# Patient Record
Sex: Male | Born: 1950 | Race: White | Hispanic: No | Smoking: Current every day smoker
Health system: Southern US, Community
[De-identification: ages and names within clinical notes are randomized; demographics above are authoritative.]

## PROBLEM LIST (undated history)

## (undated) DIAGNOSIS — J449 Chronic obstructive pulmonary disease, unspecified: Secondary | ICD-10-CM

## (undated) DIAGNOSIS — E039 Hypothyroidism, unspecified: Secondary | ICD-10-CM

## (undated) DIAGNOSIS — F4310 Post-traumatic stress disorder, unspecified: Secondary | ICD-10-CM

## (undated) DIAGNOSIS — Z9989 Dependence on other enabling machines and devices: Secondary | ICD-10-CM

## (undated) DIAGNOSIS — D649 Anemia, unspecified: Secondary | ICD-10-CM

## (undated) DIAGNOSIS — F32A Depression, unspecified: Secondary | ICD-10-CM

## (undated) DIAGNOSIS — I10 Essential (primary) hypertension: Secondary | ICD-10-CM

## (undated) DIAGNOSIS — F329 Major depressive disorder, single episode, unspecified: Secondary | ICD-10-CM

## (undated) DIAGNOSIS — G4733 Obstructive sleep apnea (adult) (pediatric): Secondary | ICD-10-CM

## (undated) HISTORY — DX: Major depressive disorder, single episode, unspecified: F32.9

## (undated) HISTORY — PX: FACIAL RECONSTRUCTION SURGERY: SHX631

## (undated) HISTORY — DX: Depression, unspecified: F32.A

## (undated) HISTORY — DX: Essential (primary) hypertension: I10

## (undated) HISTORY — PX: ARTHROSCOPY KNEE W/ DRILLING: SUR92

## (undated) HISTORY — DX: Hypothyroidism, unspecified: E03.9

## (undated) HISTORY — DX: Anemia, unspecified: D64.9

## (undated) HISTORY — DX: Dependence on other enabling machines and devices: Z99.89

## (undated) HISTORY — DX: Obstructive sleep apnea (adult) (pediatric): G47.33

## (undated) HISTORY — DX: Post-traumatic stress disorder, unspecified: F43.10

## (undated) HISTORY — DX: Chronic obstructive pulmonary disease, unspecified: J44.9

---

## 1998-08-17 ENCOUNTER — Encounter: Payer: Self-pay | Admitting: Emergency Medicine

## 1998-08-17 ENCOUNTER — Inpatient Hospital Stay (HOSPITAL_COMMUNITY): Admission: EM | Admit: 1998-08-17 | Discharge: 1998-08-18 | Payer: Self-pay | Admitting: Emergency Medicine

## 2000-08-10 ENCOUNTER — Ambulatory Visit (HOSPITAL_COMMUNITY): Admission: RE | Admit: 2000-08-10 | Discharge: 2000-08-10 | Payer: Self-pay | Admitting: *Deleted

## 2000-08-19 ENCOUNTER — Ambulatory Visit (HOSPITAL_COMMUNITY): Admission: RE | Admit: 2000-08-19 | Discharge: 2000-08-19 | Payer: Self-pay | Admitting: *Deleted

## 2000-08-19 ENCOUNTER — Encounter: Payer: Self-pay | Admitting: *Deleted

## 2001-02-20 ENCOUNTER — Encounter: Admission: RE | Admit: 2001-02-20 | Discharge: 2001-02-20 | Payer: Self-pay | Admitting: Oncology

## 2001-02-20 ENCOUNTER — Encounter (HOSPITAL_COMMUNITY): Admission: RE | Admit: 2001-02-20 | Discharge: 2001-03-22 | Payer: Self-pay | Admitting: Oncology

## 2001-03-03 ENCOUNTER — Encounter (HOSPITAL_COMMUNITY): Payer: Self-pay | Admitting: Oncology

## 2001-03-10 ENCOUNTER — Encounter (HOSPITAL_COMMUNITY): Payer: Self-pay | Admitting: Oncology

## 2001-03-17 ENCOUNTER — Observation Stay (HOSPITAL_COMMUNITY): Admission: RE | Admit: 2001-03-17 | Discharge: 2001-03-18 | Payer: Self-pay | Admitting: General Surgery

## 2001-07-28 ENCOUNTER — Emergency Department (HOSPITAL_COMMUNITY): Admission: EM | Admit: 2001-07-28 | Discharge: 2001-07-28 | Payer: Self-pay | Admitting: Emergency Medicine

## 2001-09-28 ENCOUNTER — Ambulatory Visit (HOSPITAL_COMMUNITY): Admission: RE | Admit: 2001-09-28 | Discharge: 2001-09-28 | Payer: Self-pay | Admitting: Family Medicine

## 2001-09-28 ENCOUNTER — Encounter: Payer: Self-pay | Admitting: Family Medicine

## 2001-12-08 ENCOUNTER — Encounter (HOSPITAL_COMMUNITY): Admission: RE | Admit: 2001-12-08 | Discharge: 2002-01-07 | Payer: Self-pay | Admitting: Orthopedic Surgery

## 2002-01-09 ENCOUNTER — Encounter (HOSPITAL_COMMUNITY): Admission: RE | Admit: 2002-01-09 | Discharge: 2002-02-08 | Payer: Self-pay | Admitting: Orthopedic Surgery

## 2002-02-08 ENCOUNTER — Encounter (HOSPITAL_COMMUNITY): Admission: RE | Admit: 2002-02-08 | Discharge: 2002-03-10 | Payer: Self-pay | Admitting: Orthopedic Surgery

## 2002-03-14 ENCOUNTER — Encounter (HOSPITAL_COMMUNITY): Admission: RE | Admit: 2002-03-14 | Discharge: 2002-04-13 | Payer: Self-pay | Admitting: Orthopedic Surgery

## 2004-09-15 ENCOUNTER — Emergency Department (HOSPITAL_COMMUNITY): Admission: EM | Admit: 2004-09-15 | Discharge: 2004-09-15 | Payer: Self-pay | Admitting: Emergency Medicine

## 2004-09-16 ENCOUNTER — Ambulatory Visit: Payer: Self-pay | Admitting: Orthopedic Surgery

## 2004-10-14 ENCOUNTER — Ambulatory Visit: Payer: Self-pay | Admitting: Orthopedic Surgery

## 2004-11-12 ENCOUNTER — Ambulatory Visit: Payer: Self-pay | Admitting: Orthopedic Surgery

## 2005-09-03 ENCOUNTER — Emergency Department (HOSPITAL_COMMUNITY): Admission: EM | Admit: 2005-09-03 | Discharge: 2005-09-03 | Payer: Self-pay | Admitting: Emergency Medicine

## 2006-08-20 ENCOUNTER — Emergency Department (HOSPITAL_COMMUNITY): Admission: EM | Admit: 2006-08-20 | Discharge: 2006-08-20 | Payer: Self-pay | Admitting: Emergency Medicine

## 2007-11-07 IMAGING — CT CT ABDOMEN W/O CM
1 of 2 series · 15 of 32 positions shown, 19 images · IV contrast (agent unspecified)
Comparison: None.

CLINICAL DATA: Right-sided flank pain for two days.
 ABDOMEN CT WITHOUT CONTRAST:
TECHNIQUE: Multidetector CT imaging of the abdomen was performed following the standard protocol without IV contrast.
TECHNIQUE: Multidetector CT imaging of the pelvis was performed following the standard protocol without IV contrast.

[Series 3078: — · axial · 0.66mm/px · z∈[+1367,+1732]mm · 15 of 81 slices shown, 19 images]
[im 4/81  soft-tissue]
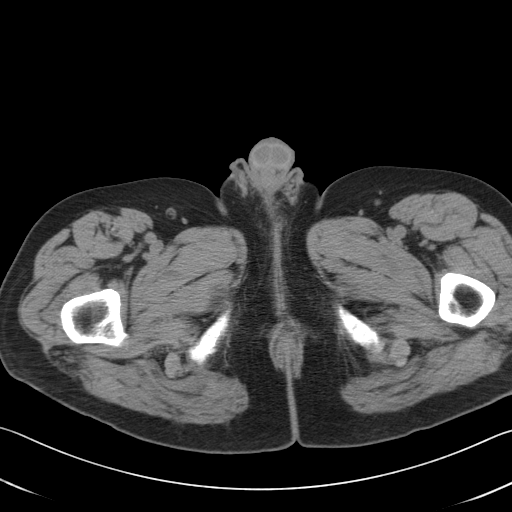
[im 4/81  bone]
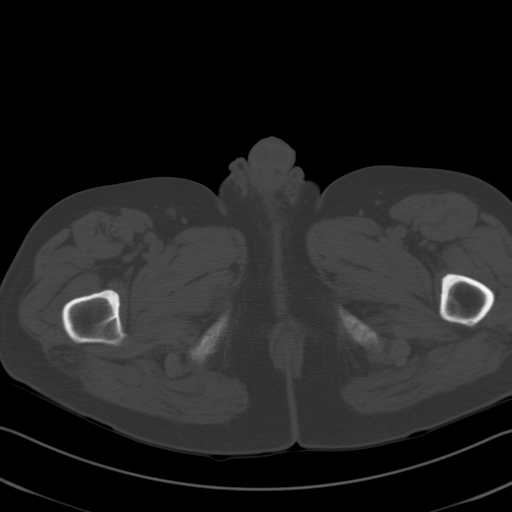
[im 10/81  soft-tissue]
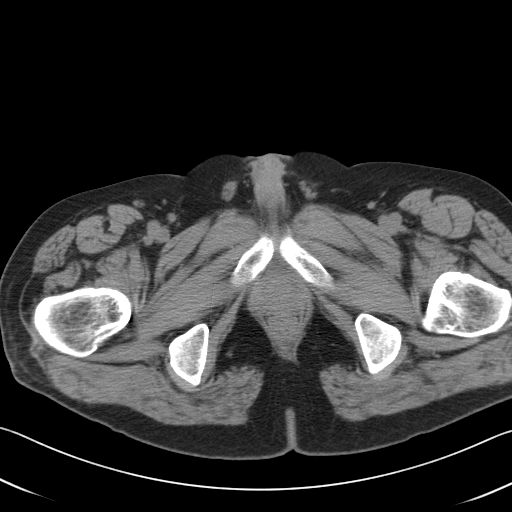
[im 17/81  soft-tissue]
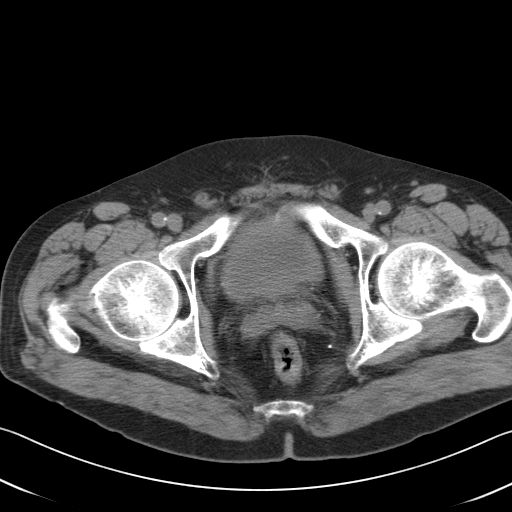
[im 23/81  soft-tissue]
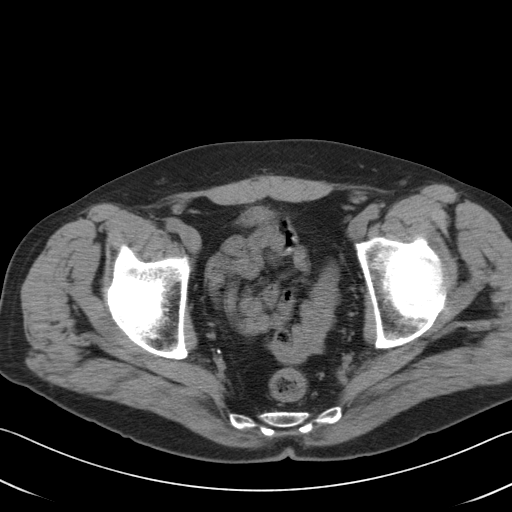
[im 29/81  soft-tissue]
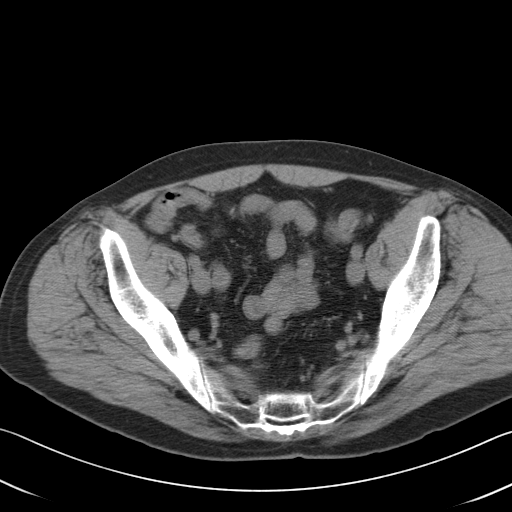
[im 36/81  soft-tissue]
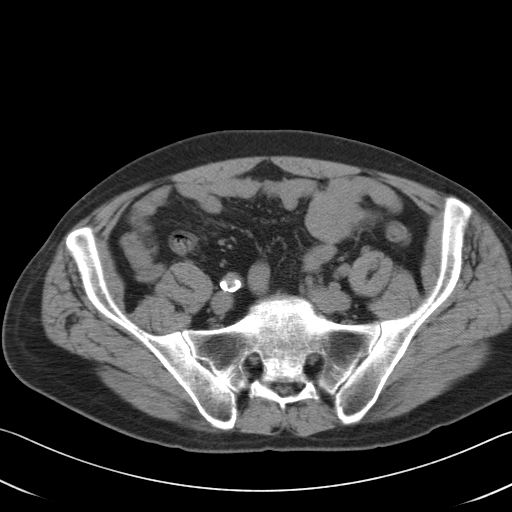
[im 42/81  soft-tissue]
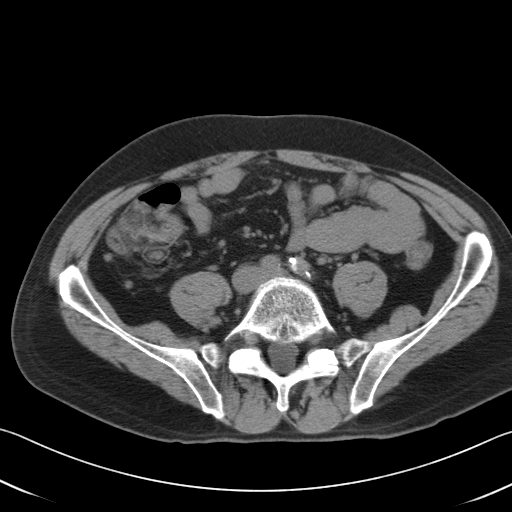
[im 45/81  soft-tissue]
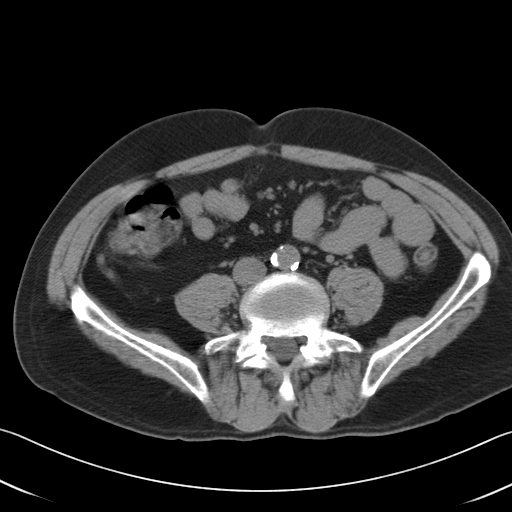
[im 52/81  soft-tissue]
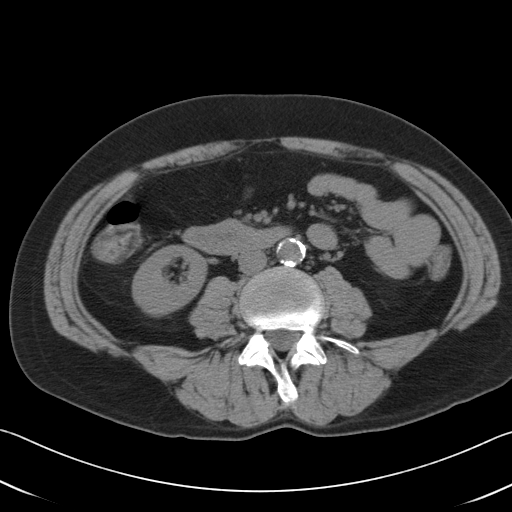
[im 52/81  bone]
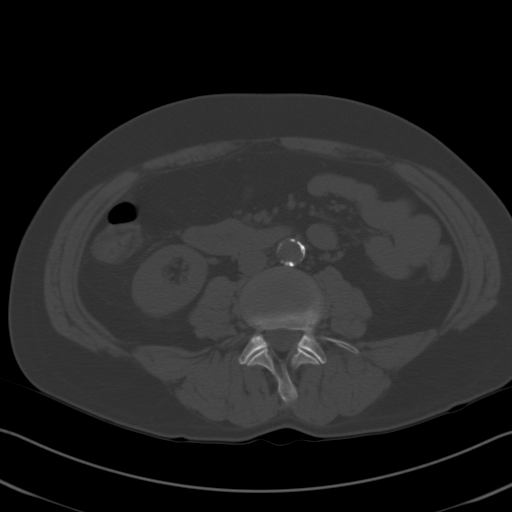
[im 58/81  soft-tissue]
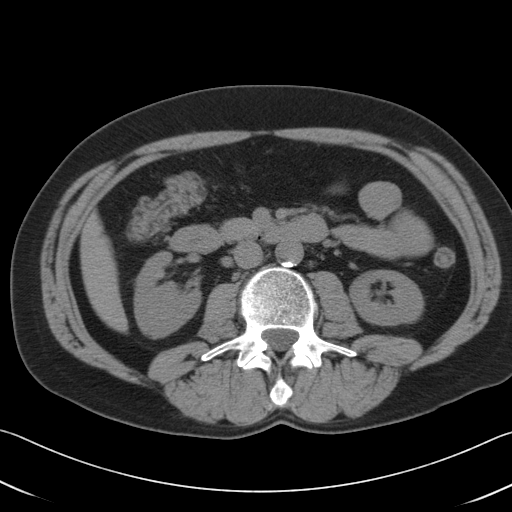
[im 65/81  soft-tissue]
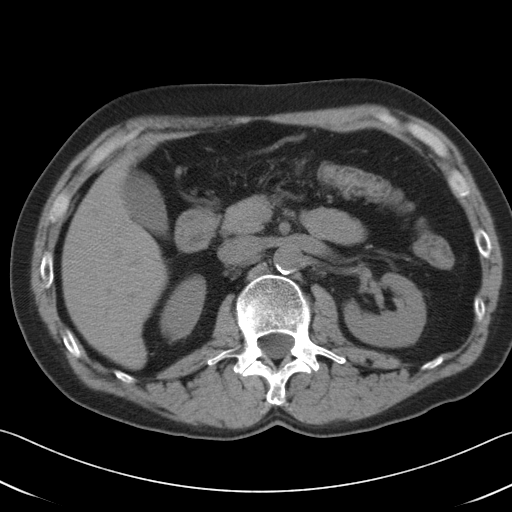
[im 68/81  lung]
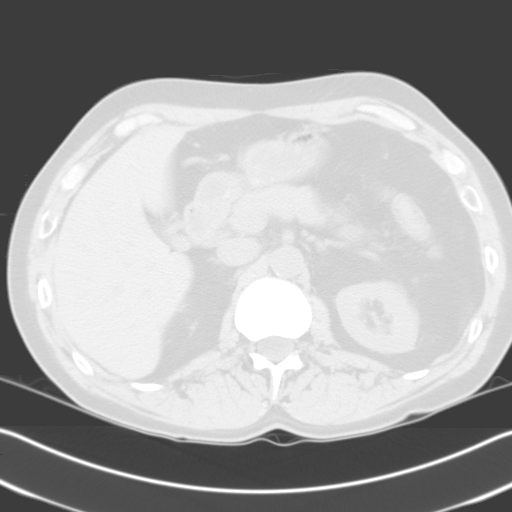
[im 71/81  soft-tissue]
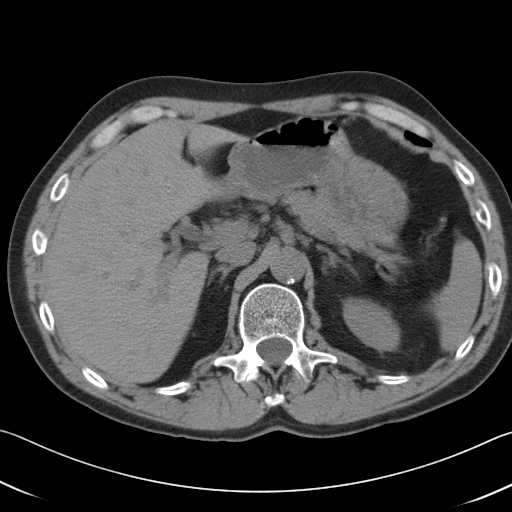
[im 71/81  lung]
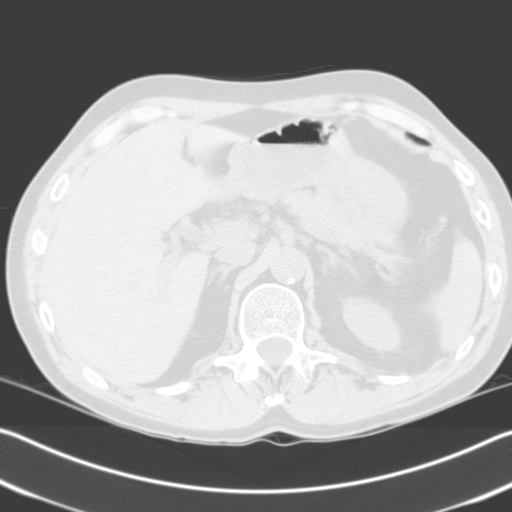
[im 74/81  lung]
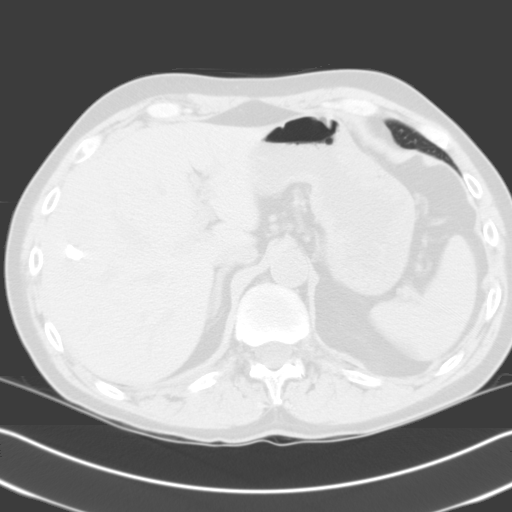
[im 77/81  soft-tissue]
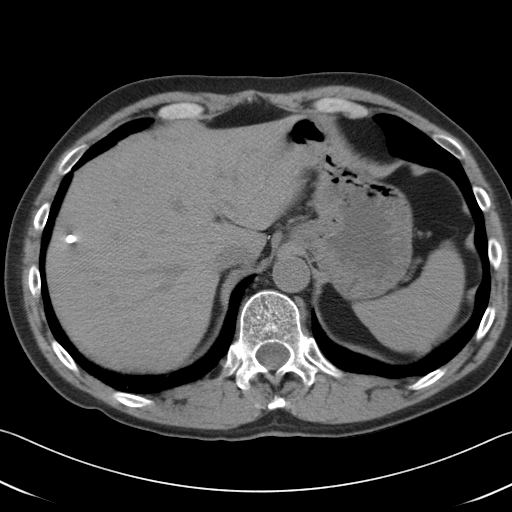
[im 77/81  lung]
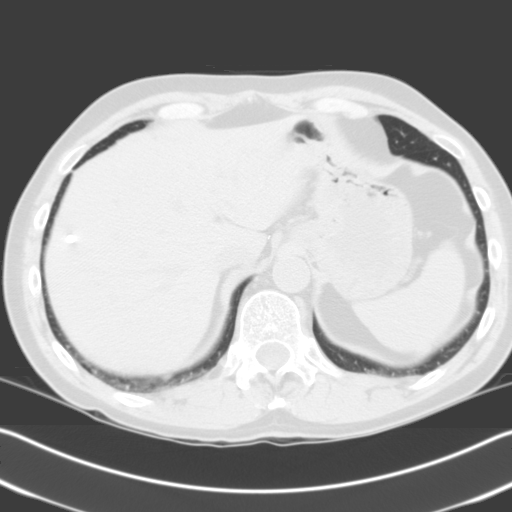

[15 of 32 positions shown; findings below may reference images not displayed]

FINDINGS: There is mild atelectasis at the lung bases.  
 Calcifications are seen within the right lobe of the liver, uncertain etiology.  The liver parenchyma is otherwise normal in attenuation and morphology.  No intrahepatic biliary ductal dilatation.  The gallbladder is negative.  The spleen is negative.  Both adrenal glands are negative.  The left kidney is normal without evidence for nephrolithiasis or hydronephrosis.  No left-sided hydroureter or ureterolithiasis.  
 The right kidney has several low-density lesions which are incompletely characterized with IV contrast, i.e. (image #26).  
 There is no right-sided nephrolithiasis, hydronephrosis, hydroureter or ureterolithiasis.  The urinary bladder is normal.
 Calcified atherosclerotic disease affects the abdominal aorta and its branches.  There is no evidence for abdominal aortic aneurysm, as stated in the clinical history.  The pancreatic parenchyma is normal.
 There is no free fluid.  
 The appendix is normal.  There is no evidence for bowel obstruction.  The small bowel loops are normal in course and in caliber.   No small bowel mesenteric adenopathy.
 Review of bone windows shows no suspicious lytic or sclerotic lesions.
IMPRESSION: 1. No acute intraabdominal CT findings.  
 2. Low-density lesions, right kidney, incompletely characterized without intravenous contrast.
 3. Normal appendix.  
 PELVIS CT WITHOUT CONTRAST:
FINDINGS: There is no adenopathy.  No free fluid.  Urinary bladder is negative.  Prostate gland and seminal vesicles are negative.  The visualized pelvic bowel loops are nonobstructive and there is no abnormal bowel wall thickening.  No abscess formation is noted. 
 Review of bone windows shows no suspicious lytic or sclerotic lesions.
IMPRESSION: No acute findings on pelvic CT.

## 2012-05-23 ENCOUNTER — Ambulatory Visit (INDEPENDENT_AMBULATORY_CARE_PROVIDER_SITE_OTHER): Payer: Medicare Other | Admitting: Family Medicine

## 2012-05-23 ENCOUNTER — Encounter: Payer: Self-pay | Admitting: Family Medicine

## 2012-05-23 VITALS — BP 130/70 | HR 63 | Resp 18 | Ht 71.5 in | Wt 158.1 lb

## 2012-05-23 DIAGNOSIS — F4310 Post-traumatic stress disorder, unspecified: Secondary | ICD-10-CM

## 2012-05-23 DIAGNOSIS — G8929 Other chronic pain: Secondary | ICD-10-CM

## 2012-05-23 DIAGNOSIS — Z720 Tobacco use: Secondary | ICD-10-CM

## 2012-05-23 DIAGNOSIS — E039 Hypothyroidism, unspecified: Secondary | ICD-10-CM

## 2012-05-23 DIAGNOSIS — F17200 Nicotine dependence, unspecified, uncomplicated: Secondary | ICD-10-CM

## 2012-05-23 DIAGNOSIS — J309 Allergic rhinitis, unspecified: Secondary | ICD-10-CM

## 2012-05-23 DIAGNOSIS — G43909 Migraine, unspecified, not intractable, without status migrainosus: Secondary | ICD-10-CM

## 2012-05-23 DIAGNOSIS — I10 Essential (primary) hypertension: Secondary | ICD-10-CM

## 2012-05-23 DIAGNOSIS — J449 Chronic obstructive pulmonary disease, unspecified: Secondary | ICD-10-CM

## 2012-05-23 MED ORDER — FLUTICASONE PROPIONATE 50 MCG/ACT NA SUSP: 2.0000 | Freq: Every day | NASAL | Status: DC

## 2012-05-23 MED ORDER — LORATADINE 10 MG PO TABS: 10.0000 mg | ORAL_TABLET | Freq: Every day | ORAL | Status: AC

## 2012-05-23 NOTE — Progress Notes (Signed)
  Subjective:    Patient ID: James Graham, male    DOB: 12/10/1950, 61 y.o.   MRN: [id number]  HPI  Pt here to establish care, PCP- [facility], previously in [place] for 12 years Medications and History reviewed  Concern about chronic rhinitis, he nose drains constantly has been on nasolide without any improvement, has also tried zyrtec which helped very little.  Chronic pain in back and legs, as well as migraines on norco as needed PTSD- followed by psych at [facility] 2012 - had polyps removed    Review of Systems - per above     GEN- denies fatigue, fever, weight loss,weakness, recent illness HEENT- denies eye drainage, change in vision,+ nasal discharge, CVS- denies chest pain, palpitations RESP- denies SOB, cough, wheeze ABD- denies N/V, change in stools, abd pain GU- denies dysuria, hematuria, dribbling, incontinence MSK- + joint pain, muscle aches, injury Neuro- denies headache, dizziness, syncope, seizure activity      Objective:   Physical Exam GEN- NAD, alert and oriented x3, walks with cane  HEENT- PERRL, EOMI, non injected sclera, pink conjunctiva, MMM, oropharynx clear, edentulous, TM clear bilat no effusion, clear rhinorrhea- mild turbinate enlargement, small circular erosion in right ear prior to canal  Neck- Supple, no LAD CVS- RRR, no murmur RESP-CTAB ABD-NABS,soft,NT, ND  EXT- No edema Pulses- Radial, DP- 2+ Psych-  Normal affect and Mood, very pleasant,       Assessment & Plan:

## 2012-05-23 NOTE — Patient Instructions (Signed)
Continue your current medications Try the flonase and new allergy pill loratadine Stop the nasalide  F/U 3 months

## 2012-05-25 ENCOUNTER — Encounter: Payer: Self-pay | Admitting: Family Medicine

## 2012-05-25 DIAGNOSIS — F4310 Post-traumatic stress disorder, unspecified: Secondary | ICD-10-CM | POA: Insufficient documentation

## 2012-05-25 DIAGNOSIS — G8929 Other chronic pain: Secondary | ICD-10-CM | POA: Insufficient documentation

## 2012-05-25 DIAGNOSIS — J449 Chronic obstructive pulmonary disease, unspecified: Secondary | ICD-10-CM | POA: Insufficient documentation

## 2012-05-25 DIAGNOSIS — E039 Hypothyroidism, unspecified: Secondary | ICD-10-CM | POA: Insufficient documentation

## 2012-05-25 DIAGNOSIS — I10 Essential (primary) hypertension: Secondary | ICD-10-CM | POA: Insufficient documentation

## 2012-05-25 DIAGNOSIS — J309 Allergic rhinitis, unspecified: Secondary | ICD-10-CM | POA: Insufficient documentation

## 2012-05-25 DIAGNOSIS — Z720 Tobacco use: Secondary | ICD-10-CM | POA: Insufficient documentation

## 2012-05-25 DIAGNOSIS — G43909 Migraine, unspecified, not intractable, without status migrainosus: Secondary | ICD-10-CM | POA: Insufficient documentation

## 2012-05-25 NOTE — Assessment & Plan Note (Signed)
BP stable, no change to meds  

## 2012-05-25 NOTE — Assessment & Plan Note (Signed)
Continue albuterol and symbicort

## 2012-05-25 NOTE — Assessment & Plan Note (Signed)
Long term use of narcotics,currently obtained by the [place]

## 2012-05-25 NOTE — Assessment & Plan Note (Signed)
D/C nasolide, trial of flonase and loratadine

## 2012-08-07 ENCOUNTER — Encounter: Payer: Self-pay | Admitting: Family Medicine

## 2012-08-22 ENCOUNTER — Ambulatory Visit (INDEPENDENT_AMBULATORY_CARE_PROVIDER_SITE_OTHER): Payer: Medicare Other | Admitting: Family Medicine

## 2012-08-22 ENCOUNTER — Encounter: Payer: Self-pay | Admitting: Family Medicine

## 2012-08-22 VITALS — BP 120/70 | HR 65 | Resp 15 | Ht 71.5 in | Wt 172.0 lb

## 2012-08-22 DIAGNOSIS — J449 Chronic obstructive pulmonary disease, unspecified: Secondary | ICD-10-CM

## 2012-08-22 DIAGNOSIS — J019 Acute sinusitis, unspecified: Secondary | ICD-10-CM

## 2012-08-22 DIAGNOSIS — I10 Essential (primary) hypertension: Secondary | ICD-10-CM

## 2012-08-22 MED ORDER — FLUTICASONE PROPIONATE 50 MCG/ACT NA SUSP: 2.0000 | Freq: Every day | NASAL | Status: AC

## 2012-08-22 MED ORDER — PREDNISONE 10 MG PO TABS: ORAL_TABLET | ORAL | Status: DC

## 2012-08-22 MED ORDER — AMOXICILLIN 500 MG PO CAPS: 500.0000 mg | ORAL_CAPSULE | Freq: Two times a day (BID) | ORAL | Status: AC

## 2012-08-22 NOTE — Progress Notes (Signed)
  Subjective:    Patient ID: James Graham, male    DOB: [birth year], 61 y.o.   MRN: [id number]  HPI Patient here to follow chronic medical problems. He's been having increased sinus drainage and pressure for the past couple weeks. He was on the Flonase which helped the postnasal drip however now he has coughing because of drainage into his chest. He denies any fever, shortness of breath. Occasionally wheezes. He plans to get flu shot at his routine visit at the [facility]. Tolerating her medications otherwise.   Review of Systems  GEN- denies fatigue, fever, weight loss,weakness, recent illness HEENT- denies eye drainage, change in vision, nasal discharge, CVS- denies chest pain, palpitations RESP- denies SOB, cough, wheeze ABD- denies N/V, change in stools, abd pain GU- denies dysuria, hematuria, dribbling, incontinence MSK- +joint pain, muscle aches, injury Neuro- denies headache, dizziness, syncope, seizure activity      Objective:   Physical Exam GEN- NAD, alert and oriented x3 HEENT- PERRL, EOMI, non injected sclera, pink conjunctiva, MMM, oropharynx mild injection, TM clear bilat no effusion, + maxillary sinus tenderness, inflammed turbinates, + Nasal drainage  Neck- Supple, no LAD CVS- RRR, no murmur ABS-NABS,soft,NT,ND RESP-bilateral wheeze, + upper airway congestion, mild rhonchi, normal WOB  EXT- No edema Pulses- Radial 2+         Assessment & Plan:

## 2012-08-22 NOTE — Assessment & Plan Note (Signed)
BP looks great today!! 

## 2012-08-22 NOTE — Assessment & Plan Note (Signed)
Antibiotics, refilled flonase, I think he would benefit from seeing ENT with his recurrent rhinitis

## 2012-08-22 NOTE — Assessment & Plan Note (Signed)
Treat for COPD , pt has acute sinus infection though not complaining of SOB, I fear he will worsen based on his lung exam which has changed Prednisone, antibiotics

## 2012-08-22 NOTE — Patient Instructions (Addendum)
Use the inhaler every 4 hours as needed Start prednisone and antibiotics You have bronchitis and sinus infection You need to see [facility] N[facility] Th[facility], [facility] Use the ointment in your nose  Do not start the flonase until next week  F/U 4 months

## 2012-10-11 DIAGNOSIS — G4733 Obstructive sleep apnea (adult) (pediatric): Secondary | ICD-10-CM

## 2012-10-11 HISTORY — DX: Obstructive sleep apnea (adult) (pediatric): G47.33

## 2012-12-21 ENCOUNTER — Ambulatory Visit: Payer: Medicare Other | Admitting: Family Medicine

## 2012-12-26 ENCOUNTER — Encounter: Payer: Self-pay | Admitting: Family Medicine

## 2012-12-26 ENCOUNTER — Ambulatory Visit (INDEPENDENT_AMBULATORY_CARE_PROVIDER_SITE_OTHER): Payer: Medicare Other | Admitting: Family Medicine

## 2012-12-26 VITALS — BP 138/80 | HR 72 | Resp 16 | Wt 170.0 lb

## 2012-12-26 DIAGNOSIS — Z720 Tobacco use: Secondary | ICD-10-CM

## 2012-12-26 DIAGNOSIS — F17200 Nicotine dependence, unspecified, uncomplicated: Secondary | ICD-10-CM

## 2012-12-26 DIAGNOSIS — G4733 Obstructive sleep apnea (adult) (pediatric): Secondary | ICD-10-CM

## 2012-12-26 DIAGNOSIS — I10 Essential (primary) hypertension: Secondary | ICD-10-CM

## 2012-12-26 DIAGNOSIS — E039 Hypothyroidism, unspecified: Secondary | ICD-10-CM

## 2012-12-26 DIAGNOSIS — Z9989 Dependence on other enabling machines and devices: Secondary | ICD-10-CM

## 2012-12-26 DIAGNOSIS — J449 Chronic obstructive pulmonary disease, unspecified: Secondary | ICD-10-CM

## 2012-12-26 NOTE — Assessment & Plan Note (Signed)
Counseled on cessation, he has cut down significantly

## 2012-12-26 NOTE — Progress Notes (Signed)
  Subjective:    Patient ID: James Graham, male    DOB: 04-Oct-1951, 62 y.o.   MRN: [id number]  HPI  Pt here to f/u chronic medical problems. No specific concerns. He is due to have ear nose and throat surgery secondary to polyps and deviated septum. He was also diagnosed with obstructive sleep apnea and has been on CPAP for the past week. COPD is doing well. [facility] in appointment. 2 weeks   Review of Systems   GEN- denies fatigue, fever, weight loss,weakness, recent illness HEENT- denies eye drainage, change in vision, nasal discharge, CVS- denies chest pain, palpitations RESP- denies SOB, cough, wheeze ABD- denies N/V, change in stools, abd pain GU- denies dysuria, hematuria, dribbling, incontinence MSK- denies joint pain, muscle aches, injury Neuro- denies headache, dizziness, syncope, seizure activity      Objective:   Physical Exam   GEN- NAD, alert and oriented x3 HEENT- PERRL, EOMI, non injected sclera, pink conjunctiva, MMM, oropharynx clear Neck- Supple, no thryomegaly CVS- RRR, no murmur RESP-CTAB EXT- No edema Pulses- Radial, DP- 2+        Assessment & Plan:

## 2012-12-26 NOTE — Patient Instructions (Signed)
Continue current medications F/U 6 months or as needed 

## 2012-12-26 NOTE — Assessment & Plan Note (Signed)
Back at baseline, breathing much improved

## 2012-12-26 NOTE — Assessment & Plan Note (Signed)
Repeat labs needed, he will have them done at [place] in 2 weeks

## 2012-12-26 NOTE — Assessment & Plan Note (Signed)
Well controlled 

## 2013-02-06 ENCOUNTER — Encounter: Payer: Self-pay | Admitting: *Deleted

## 2013-06-27 ENCOUNTER — Ambulatory Visit: Payer: Self-pay | Admitting: Family Medicine

## 2013-06-28 ENCOUNTER — Ambulatory Visit: Payer: Medicare Other | Admitting: Family Medicine

## 2013-07-30 ENCOUNTER — Ambulatory Visit: Payer: Self-pay | Admitting: Family Medicine

## 2013-08-02 ENCOUNTER — Ambulatory Visit (INDEPENDENT_AMBULATORY_CARE_PROVIDER_SITE_OTHER): Payer: Medicare Other | Admitting: Family Medicine

## 2013-08-02 ENCOUNTER — Encounter: Payer: Self-pay | Admitting: Family Medicine

## 2013-08-02 VITALS — BP 110/60 | HR 80 | Temp 97.3°F | Resp 18 | Wt 164.0 lb

## 2013-08-02 DIAGNOSIS — I10 Essential (primary) hypertension: Secondary | ICD-10-CM

## 2013-08-02 DIAGNOSIS — E039 Hypothyroidism, unspecified: Secondary | ICD-10-CM

## 2013-08-02 DIAGNOSIS — G8929 Other chronic pain: Secondary | ICD-10-CM

## 2013-08-02 NOTE — Progress Notes (Signed)
Subjective:    Patient ID: James Graham, male    DOB: 05-Nov-1950, 62 y.o.   MRN: [id number]  HPI Patient's here today for followup of his hypertension. His blood pressure is well controlled 110/60. He denies any chest pain or shortness of breath at rest. He does have some baseline dyspnea on exertion due to COPD this is stable. He is also here today to discuss his hypothyroidism. He is currently on levothyroxine 25 mcg by mouth daily. He recently had labwork done at the [place] which reportedly was normal. The patient will get this labwork and brain office I can review it. He continues to complain of daily headaches and chronic pain in his shoulders and lower back. He is currently taking gabapentin 300 mg by mouth 3 times a day. He also takes Norco 5/325 as needed for pain. He is interested in further options for pain control. He's had his flu shot as well as his pneumonia shot at the [place]. Past Medical History  Diagnosis Date  . Hypertension   . Hypothyroidism   . COPD (chronic obstructive pulmonary disease)     PFT 2012 mild obstruction  . Depression   . PTSD (post-traumatic stress disorder)   . Anemia   . OSA on CPAP 2014    [facility]   Current Outpatient Prescriptions on File Prior to Visit  Medication Sig Dispense Refill  . albuterol (PROVENTIL HFA;VENTOLIN HFA) 108 (90 BASE) MCG/ACT inhaler Inhale 2 puffs into the lungs every 4 (four) hours as needed.      . budesonide-formoterol (SYMBICORT) 80-4.5 MCG/ACT inhaler Inhale 2 puffs into the lungs 2 (two) times daily.      Marland Kitchen buPROPion (WELLBUTRIN) 100 MG tablet Take 100 mg by mouth daily.      . Calcium Carbonate-Vitamin D (CALCIUM 600 + D PO) Take by mouth.      . fluticasone (FLONASE) 50 MCG/ACT nasal spray Place 2 sprays into the nose daily.  16 g  2  . folic acid (FOLVITE) 1 MG tablet Take 1 mg by mouth daily.      Marland Kitchen gabapentin (NEURONTIN) 300 MG capsule Take 300 mg by mouth 3 (three) times daily.      . hydrochlorothiazide (HYDRODIURIL) 25 MG  tablet Take 25 mg by mouth daily. Take 1/2 tablet daily      . HYDROcodone-acetaminophen (NORCO/VICODIN) 5-325 MG per tablet Take 1 tablet by mouth 2 (two) times daily.      . hydroxypropyl methylcellulose (ISOPTO TEARS) 2.5 % ophthalmic solution 1 drop.      Marland Kitchen LEVOTHYROXINE SODIUM PO Take 0.025 mg by mouth daily.      Marland Kitchen omeprazole (PRILOSEC) 20 MG capsule Take 20 mg by mouth daily. Take 2 capsules daily      . predniSONE (DELTASONE) 10 MG tablet Take  40mg  x 5  20 tablet  0  . risperiDONE (RISPERDAL) 3 MG tablet Take 3 mg by mouth daily. 1/2 tablet daily at bedtime      . sertraline (ZOLOFT) 100 MG tablet Take 100 mg by mouth daily. Take two before bedtime      . Travoprost, BAK Free, (TRAVATAN) 0.004 % SOLN ophthalmic solution Place 1 drop into both eyes at bedtime.      . traZODone (DESYREL) 100 MG tablet Take 100 mg by mouth at bedtime. Take 2 tablets daily at bedtime as needed for sleep      . loratadine (CLARITIN) 10 MG tablet Take 1 tablet (10 mg total) by mouth daily.  30 tablet  2   No current facility-administered medications on file prior to visit.   Allergies  Allergen Reactions  . Bee Venom Anaphylaxis  . Coconut Oil Anaphylaxis and Hives  . Palm Oil [Hydrogenated Palm Oil Glycerides] Anaphylaxis and Hives  . Advil [Ibuprofen] Rash  . Bacitracin Itching and Rash   History   Social History  . Marital Status: Divorced    Spouse Name: N/A    Number of Children: N/A  . Years of Education: N/A   Occupational History  . Not on file.   Social History Main Topics  . Smoking status: Current Every Day Smoker -- 0.50 packs/day  . Smokeless tobacco: Not on file  . Alcohol Use: .5 - 1 oz/week    1-2 drink(s) per week  . Drug Use: No  . Sexual Activity: Not on file   Other Topics Concern  . Not on file   Social History Narrative  . No narrative on file      Review of Systems  All other systems reviewed and are negative.       Objective:   Physical Exam  Vitals  reviewed. Constitutional: He appears well-developed and well-nourished.  Neck: Neck supple. No JVD present. No thyromegaly present.  Cardiovascular: Normal rate, regular rhythm, normal heart sounds and intact distal pulses.  Exam reveals no gallop and no friction rub.   No murmur heard. Pulmonary/Chest: Effort normal. No respiratory distress. He has wheezes. He has no rales. He exhibits no tenderness.  Abdominal: Soft. Bowel sounds are normal. He exhibits no distension and no mass. There is no tenderness. There is no rebound and no guarding.  Lymphadenopathy:    He has no cervical adenopathy.          Assessment & Plan:  1. Hypothyroidism Bring by most recent labs I can review his TSH. I will titrate levothyroxin to achieve a TSH in the therapeutic range.  2. Essential hypertension, benign Blood pressure is well controlled. Continue hydrochlorothiazide.  Right to about his most recent lab work to check his fasting lipid panel, bmp, and potassium.  3. Chronic pain Increase gabapentin to 600 mg every morning, 300 mg by mouth every afternoon, and 600 mg by mouth each bedtime.

## 2014-01-06 ENCOUNTER — Emergency Department (HOSPITAL_COMMUNITY): Payer: Medicare HMO

## 2014-01-06 ENCOUNTER — Encounter (HOSPITAL_COMMUNITY): Payer: Self-pay | Admitting: Emergency Medicine

## 2014-01-06 ENCOUNTER — Emergency Department (HOSPITAL_COMMUNITY)
Admission: EM | Admit: 2014-01-06 | Discharge: 2014-01-06 | Disposition: A | Discharge status: Home / Self Care | Payer: Medicare HMO | Attending: Emergency Medicine | Admitting: Emergency Medicine

## 2014-01-06 DIAGNOSIS — Y939 Activity, unspecified: Secondary | ICD-10-CM | POA: Insufficient documentation

## 2014-01-06 DIAGNOSIS — J4489 Other specified chronic obstructive pulmonary disease: Secondary | ICD-10-CM | POA: Insufficient documentation

## 2014-01-06 DIAGNOSIS — F3289 Other specified depressive episodes: Secondary | ICD-10-CM | POA: Insufficient documentation

## 2014-01-06 DIAGNOSIS — F329 Major depressive disorder, single episode, unspecified: Secondary | ICD-10-CM | POA: Insufficient documentation

## 2014-01-06 DIAGNOSIS — F4310 Post-traumatic stress disorder, unspecified: Secondary | ICD-10-CM | POA: Insufficient documentation

## 2014-01-06 DIAGNOSIS — R296 Repeated falls: Secondary | ICD-10-CM | POA: Insufficient documentation

## 2014-01-06 DIAGNOSIS — IMO0002 Reserved for concepts with insufficient information to code with codable children: Secondary | ICD-10-CM | POA: Insufficient documentation

## 2014-01-06 DIAGNOSIS — Z79899 Other long term (current) drug therapy: Secondary | ICD-10-CM | POA: Insufficient documentation

## 2014-01-06 DIAGNOSIS — G4733 Obstructive sleep apnea (adult) (pediatric): Secondary | ICD-10-CM | POA: Insufficient documentation

## 2014-01-06 DIAGNOSIS — S20219A Contusion of unspecified front wall of thorax, initial encounter: Secondary | ICD-10-CM

## 2014-01-06 DIAGNOSIS — F17200 Nicotine dependence, unspecified, uncomplicated: Secondary | ICD-10-CM | POA: Insufficient documentation

## 2014-01-06 DIAGNOSIS — D649 Anemia, unspecified: Secondary | ICD-10-CM | POA: Insufficient documentation

## 2014-01-06 DIAGNOSIS — J449 Chronic obstructive pulmonary disease, unspecified: Secondary | ICD-10-CM | POA: Insufficient documentation

## 2014-01-06 DIAGNOSIS — E039 Hypothyroidism, unspecified: Secondary | ICD-10-CM | POA: Insufficient documentation

## 2014-01-06 DIAGNOSIS — I10 Essential (primary) hypertension: Secondary | ICD-10-CM | POA: Insufficient documentation

## 2014-01-06 DIAGNOSIS — Y929 Unspecified place or not applicable: Secondary | ICD-10-CM | POA: Insufficient documentation

## 2014-01-06 MED ORDER — HYDROCODONE-ACETAMINOPHEN 5-325 MG PO TABS: 1.0000 | ORAL_TABLET | ORAL | Status: AC | PRN

## 2014-01-06 NOTE — ED Notes (Signed)
Fell x 1 wk ago hitting center chest on edge of bathtub.  Denies hitting head.  Reports sternal cp since.

## 2014-01-06 NOTE — ED Provider Notes (Signed)
CSN: [id number]     Arrival date & time 01/06/14  0910 History   First MD Initiated Contact with Patient 01/06/14 [id number]     This chart was scribed for Raeford RazorStephen King Pinzon, MD by Arlan OrganAshley Leger, ED Scribe. This patient was seen in room APA07/APA07 and the patient's care was started 9:47 AM.   Chief Complaint  Patient presents with  . Fall   The history is provided by the patient. No language interpreter was used.    HPI Comments: James Graham is a 63 y.o. male who presents to the Emergency Department complaining of a fall x 1 week. Pt states he fell while in the bathtub landing on his R arm and chest. Denies any head injury or trauma at time of fall. Pt now c/o pain to the center of his chest and pain to the R arm that is constant and gradually worsening. He has not tried anything OTC or prescribed for his discomfort. Pt states he typically takes Hydrocodone for a "blood disease", but says he is currently out of his prescription. At this time he denies any SOB, dysuria, nausea, or abdominal . No other concerns this visit.    Past Medical History  Diagnosis Date  . Hypertension   . Hypothyroidism   . COPD (chronic obstructive pulmonary disease)     PFT 2012 mild obstruction  . Depression   . PTSD (post-traumatic stress disorder)   . Anemia   . OSA on CPAP 2014    [facility]   Past Surgical History  Procedure Laterality Date  . Arthroscopy knee w/ drilling      left  . Facial reconstruction surgery     Family History  Problem Relation Age of Onset  . Cancer Mother   . Hyperlipidemia Mother   . Hypertension Mother   . Heart disease Mother   . Hyperlipidemia Father   . Hypertension Father    History  Substance Use Topics  . Smoking status: Current Every Day Smoker -- 0.50 packs/day    Types: Cigarettes  . Smokeless tobacco: Not on file  . Alcohol Use: .5 - 1 oz/week    1-2 drink(s) per week     Comment: occ    Review of Systems  Constitutional: Negative for fever and chills.  HENT:  Negative for congestion.   Respiratory: Negative for cough.   Cardiovascular: Positive for chest pain.  Gastrointestinal: Negative for nausea, vomiting and abdominal pain.  Musculoskeletal: Positive for arthralgias.  Skin: Negative for rash.  Psychiatric/Behavioral: Negative for confusion.  All other systems reviewed and are negative.      Allergies  Bee venom; Coconut oil; Palm oil; Advil; and Bacitracin  Home Medications   Current Outpatient Rx  Name  Route  Sig  Dispense  Refill  . albuterol (PROVENTIL HFA;VENTOLIN HFA) 108 (90 BASE) MCG/ACT inhaler   Inhalation   Inhale 2 puffs into the lungs every 4 (four) hours as needed.         . budesonide-formoterol (SYMBICORT) 80-4.5 MCG/ACT inhaler   Inhalation   Inhale 2 puffs into the lungs 2 (two) times daily.         Marland Kitchen. buPROPion (WELLBUTRIN) 100 MG tablet   Oral   Take 100 mg by mouth daily.         . Calcium Carbonate-Vitamin D (CALCIUM 600 + D PO)   Oral   Take by mouth.         . EXPIRED: fluticasone (FLONASE) 50 MCG/ACT nasal spray  Nasal   Place 2 sprays into the nose daily.   16 g   2   . folic acid (FOLVITE) 1 MG tablet   Oral   Take 1 mg by mouth daily.         Marland Kitchen gabapentin (NEURONTIN) 300 MG capsule   Oral   Take 300 mg by mouth 3 (three) times daily.         . hydrochlorothiazide (HYDRODIURIL) 25 MG tablet   Oral   Take 25 mg by mouth daily. Take 1/2 tablet daily         . HYDROcodone-acetaminophen (NORCO/VICODIN) 5-325 MG per tablet   Oral   Take 1 tablet by mouth 2 (two) times daily.         . hydroxypropyl methylcellulose (ISOPTO TEARS) 2.5 % ophthalmic solution      1 drop.         Marland Kitchen LEVOTHYROXINE SODIUM PO   Oral   Take 0.025 mg by mouth daily.         Marland Kitchen EXPIRED: loratadine (CLARITIN) 10 MG tablet   Oral   Take 1 tablet (10 mg total) by mouth daily.   30 tablet   2   . omeprazole (PRILOSEC) 20 MG capsule   Oral   Take 20 mg by mouth daily. Take 2 capsules  daily         . predniSONE (DELTASONE) 10 MG tablet      Take  40mg  x 5   20 tablet   0   . risperiDONE (RISPERDAL) 3 MG tablet   Oral   Take 3 mg by mouth daily. 1/2 tablet daily at bedtime         . sertraline (ZOLOFT) 100 MG tablet   Oral   Take 100 mg by mouth daily. Take two before bedtime         . Travoprost, BAK Free, (TRAVATAN) 0.004 % SOLN ophthalmic solution   Both Eyes   Place 1 drop into both eyes at bedtime.         . traZODone (DESYREL) 100 MG tablet   Oral   Take 100 mg by mouth at bedtime. Take 2 tablets daily at bedtime as needed for sleep          Triage Vitals: BP 142/75  Pulse 67  Temp(Src) 97.7 F (36.5 C) (Oral)  Resp 20  Ht 5\' 11"  (1.803 m)  Wt 163 lb (73.936 kg)  BMI 22.74 kg/m2  SpO2 100%   Physical Exam  Nursing note and vitals reviewed. Constitutional: He is oriented to person, place, and time. He appears well-developed and well-nourished.  HENT:  Head: Normocephalic.  Eyes: EOM are normal.  Neck: Normal range of motion.  Cardiovascular: Normal rate and regular rhythm.   No murmur heard. Pulmonary/Chest: Effort normal and breath sounds normal.  Abdominal: He exhibits no distension.  Musculoskeletal: Normal range of motion.  Tenderness to palpation over mid sternal No crepitus or deformity  Neurological: He is alert and oriented to person, place, and time.  Skin:  No overlying skin changes  Psychiatric: He has a normal mood and affect.    ED Course  Procedures (including critical care time)  DIAGNOSTIC STUDIES: Oxygen Saturation is 100% on RA, Normal by my interpretation.    COORDINATION OF CARE: 9:58 AM- Will order DG chest 2 view. Discussed treatment plan with pt at bedside and pt agreed to plan.     Labs Review Labs Reviewed - No data to display  Imaging Review Dg Chest 2 View  01/06/2014   CLINICAL DATA:  Fall 1 week ago and hit sternum. Patient complains of pain.  EXAM: CHEST  2 VIEW  COMPARISON:  None.   FINDINGS: Two views of the chest demonstrate mild hyperinflation. There is no focal airspace disease or pneumothorax. Heart and mediastinum are within normal limits. There is no gross abnormality to the sternum. No acute bone abnormality.  IMPRESSION: No acute cardiopulmonary disease.   Electronically Signed   By: Richarda Overlie M.D.   On: 01/06/2014 09:50     EKG Interpretation None      MDM   Final diagnoses:  Contusion of chest    63yM with CP after fall. Imaging neg. Symptomatic tx.   I personally preformed the services scribed in my presence. The recorded information has been reviewed is accurate. Raeford Razor, MD.   Raeford Razor, MD 01/10/14 [phone]

## 2014-01-06 NOTE — Discharge Instructions (Signed)
Chest Contusion A chest contusion is a deep bruise on your chest area. Contusions are the result of an injury that caused bleeding under the skin. A chest contusion may involve bruising of the skin, muscles, or ribs. The contusion may turn blue, purple, or yellow. Minor injuries will give you a painless contusion, but more severe contusions may stay painful and swollen for a few weeks. CAUSES  A contusion is usually caused by a blow, trauma, or direct force to an area of the body. SYMPTOMS   Swelling and redness of the injured area.  Discoloration of the injured area.  Tenderness and soreness of the injured area.  Pain. DIAGNOSIS  The diagnosis can be made by taking a history and performing a physical exam. An X-ray, CT scan, or MRI may be needed to determine if there were any associated injuries, such as broken bones (fractures) or internal injuries. TREATMENT  Often, the best treatment for a chest contusion is resting, icing, and applying cold compresses to the injured area. Deep breathing exercises may be recommended to reduce the risk of pneumonia. Over-the-counter medicines may also be recommended for pain control. HOME CARE INSTRUCTIONS   Put ice on the injured area.  Put ice in a plastic bag.  Place a towel between your skin and the bag.  Leave the ice on for 15-20 minutes, 03-04 times a day.  Only take over-the-counter or prescription medicines as directed by your caregiver. Your caregiver may recommend avoiding anti-inflammatory medicines (aspirin, ibuprofen, and naproxen) for 48 hours because these medicines may increase bruising.  Rest the injured area.  Perform deep-breathing exercises as directed by your caregiver.  Stop smoking if you smoke.  Do not lift objects over 5 pounds (2.3 kg) for 3 days or longer if recommended by your caregiver. SEEK IMMEDIATE MEDICAL CARE IF:   You have increased bruising or swelling.  You have pain that is getting worse.  You have  difficulty breathing.  You have dizziness, weakness, or fainting.  You have blood in your urine or stool.  You cough up or vomit blood.  Your swelling or pain is not relieved with medicines. MAKE SURE YOU:   Understand these instructions.  Will watch your condition.  Will get help right away if you are not doing well or get worse. Document Released: 06/22/2001 Document Revised: 06/21/2012 Document Reviewed: 03/20/2012 [facility] Patient Information 2014 [place], [place].  Chest Contusion A chest contusion is a deep bruise on your chest area. Contusions are the result of an injury that caused bleeding under the skin. A chest contusion may involve bruising of the skin, muscles, or ribs. The contusion may turn blue, purple, or yellow. Minor injuries will give you a painless contusion, but more severe contusions may stay painful and swollen for a few weeks. CAUSES  A contusion is usually caused by a blow, trauma, or direct force to an area of the body. SYMPTOMS   Swelling and redness of the injured area.  Discoloration of the injured area.  Tenderness and soreness of the injured area.  Pain. DIAGNOSIS  The diagnosis can be made by taking a history and performing a physical exam. An X-ray, CT scan, or MRI may be needed to determine if there were any associated injuries, such as broken bones (fractures) or internal injuries. TREATMENT  Often, the best treatment for a chest contusion is resting, icing, and applying cold compresses to the injured area. Deep breathing exercises may be recommended to reduce the risk of pneumonia. Over-the-counter  medicines may also be recommended for pain control. HOME CARE INSTRUCTIONS   Put ice on the injured area.  Put ice in a plastic bag.  Place a towel between your skin and the bag.  Leave the ice on for 15-20 minutes, 03-04 times a day.  Only take over-the-counter or prescription medicines as directed by your caregiver. Your caregiver may  recommend avoiding anti-inflammatory medicines (aspirin, ibuprofen, and naproxen) for 48 hours because these medicines may increase bruising.  Rest the injured area.  Perform deep-breathing exercises as directed by your caregiver.  Stop smoking if you smoke.  Do not lift objects over 5 pounds (2.3 kg) for 3 days or longer if recommended by your caregiver. SEEK IMMEDIATE MEDICAL CARE IF:   You have increased bruising or swelling.  You have pain that is getting worse.  You have difficulty breathing.  You have dizziness, weakness, or fainting.  You have blood in your urine or stool.  You cough up or vomit blood.  Your swelling or pain is not relieved with medicines. MAKE SURE YOU:   Understand these instructions.  Will watch your condition.  Will get help right away if you are not doing well or get worse. Document Released: 06/22/2001 Document Revised: 06/21/2012 Document Reviewed: 03/20/2012 [facility] Patient Information 2014 [place], [place].  Blunt Chest Trauma Blunt chest trauma is an injury caused by a blow to the chest. These chest injuries can be very painful. Blunt chest trauma often results in bruised or broken (fractured) ribs. Most cases of bruised and fractured ribs from blunt chest traumas get better after 1 to 3 weeks of rest and pain medicine. Often, the soft tissue in the chest wall is also injured, causing pain and bruising. Internal organs, such as the heart and lungs, may also be injured. Blunt chest trauma can lead to serious medical problems. This injury requires immediate medical care. CAUSES   Motor vehicle collisions.  Falls.  Physical violence.  Sports injuries. SYMPTOMS   Chest pain. The pain may be worse when you move or breathe deeply.  Shortness of breath.  Lightheadedness.  Bruising.  Tenderness.  Swelling. DIAGNOSIS  Your caregiver will do a physical exam. X-rays may be taken to look for fractures. However, minor rib fractures may  not show up on X-rays until a few days after the injury. If a more serious injury is suspected, further imaging tests may be done. This may include ultrasounds, computed tomography (CT) scans, or magnetic resonance imaging (MRI). TREATMENT  Treatment depends on the severity of your injury. Your caregiver may prescribe pain medicines and deep breathing exercises. HOME CARE INSTRUCTIONS  Limit your activities until you can move around without much pain.  Do not do any strenuous work until your injury is healed.  Put ice on the injured area.  Put ice in a plastic bag.  Place a towel between your skin and the bag.  Leave the ice on for 15-20 minutes, 03-04 times a day.  You may wear a rib belt as directed by your caregiver to reduce pain.  Practice deep breathing as directed by your caregiver to keep your lungs clear.  Only take over-the-counter or prescription medicines for pain, fever, or discomfort as directed by your caregiver. SEEK IMMEDIATE MEDICAL CARE IF:   You have increasing pain or shortness of breath.  You cough up blood.  You have nausea, vomiting, or abdominal pain.  You have a fever.  You feel dizzy, weak, or you faint. MAKE SURE YOU:  Understand these instructions.  Will watch your condition.  Will get help right away if you are not doing well or get worse. Document Released: 11/04/2004 Document Revised: 12/20/2011 Document Reviewed: 07/14/2011 [facility] Patient Information 2014 [place], [place].

## 2014-03-29 ENCOUNTER — Telehealth: Payer: Self-pay | Admitting: *Deleted

## 2014-03-29 NOTE — Telephone Encounter (Signed)
Contacted patient to inform him that our records show that he is due for a pneumonia shot and offered to schedule him for a nurse visit, [facility] and will let them know when he goes back for routine visit.

## 2014-07-17 ENCOUNTER — Telehealth: Payer: Self-pay | Admitting: Family Medicine

## 2014-07-17 NOTE — Telephone Encounter (Signed)
[facility] for a wellness visit after patient not been seen about 3 days. He was found dead line in the prone position in rigor mortis. [facility] as well as myself last seen in 2014. Do not suspect any foul play. He does have history of hypertension COPD hyperlipidemia continued tobacco use, PTSD.  Cause of death likely cardiac arrest contributing factors COPD hypertension tobacco use  Death certificate to be completed

## 2014-08-11 DEATH — deceased

## 2015-03-26 IMAGING — CR DG CHEST 2V
2 series · 2 of 2 positions shown · non-contrast
Comparison: None.

CLINICAL DATA: Fall 1 week ago and hit sternum. Patient complains
of pain.

EXAM:
CHEST  2 VIEW

[view not recorded (1 of 2)]
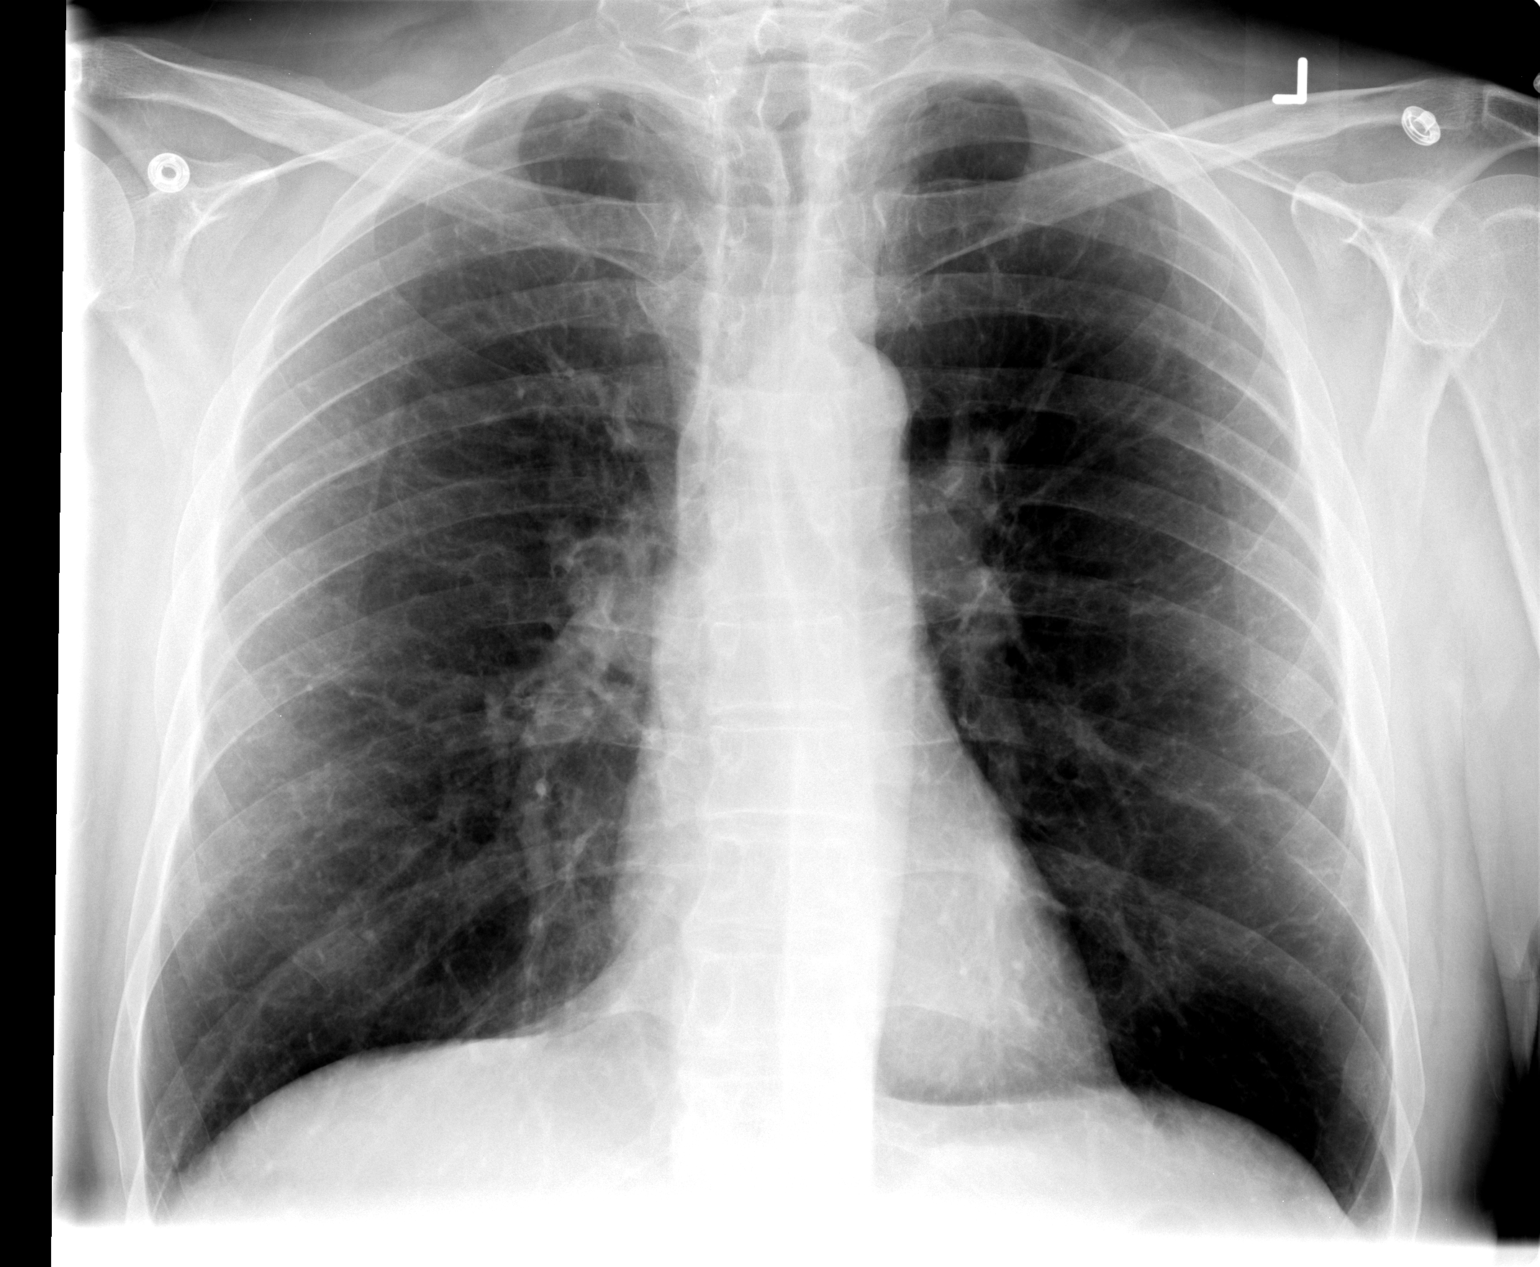

[view not recorded (2 of 2)]
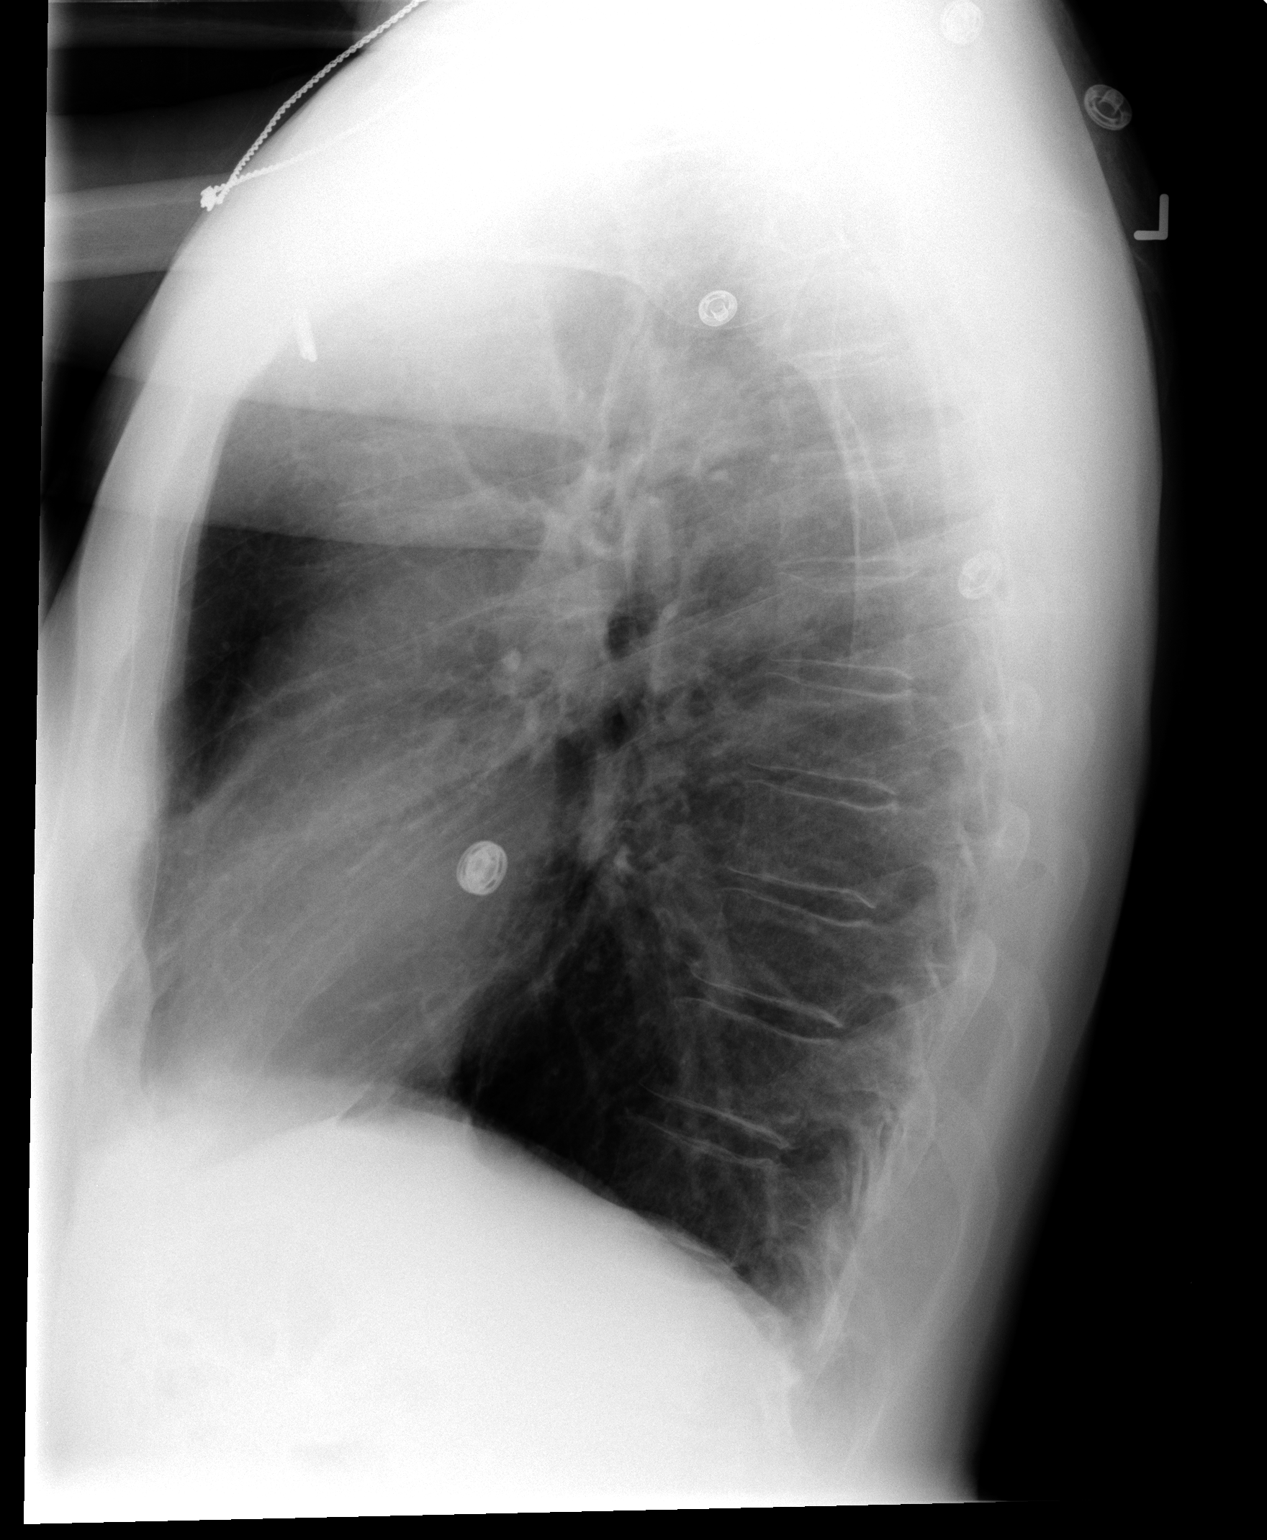

[2 of 2 positions shown; findings below may reference images not displayed]

FINDINGS: Two views of the chest demonstrate mild hyperinflation. There is no
focal airspace disease or pneumothorax. Heart and mediastinum are
within normal limits. There is no gross abnormality to the sternum.
No acute bone abnormality.
IMPRESSION: No acute cardiopulmonary disease.
